# Patient Record
Sex: Female | Born: 1975 | Race: White | Hispanic: No
Health system: Southern US, Community
[De-identification: ages and names within clinical notes are randomized; demographics above are authoritative.]

---

## 2018-11-15 ENCOUNTER — Encounter: Payer: Self-pay | Admitting: Medical Oncology

## 2018-11-15 ENCOUNTER — Emergency Department: Payer: Self-pay

## 2018-11-15 ENCOUNTER — Emergency Department
Admission: EM | Admit: 2018-11-15 | Discharge: 2018-11-15 | Disposition: A | Discharge status: Other Acute Inpatient Hospital | Payer: Self-pay | Attending: Emergency Medicine | Admitting: Emergency Medicine

## 2018-11-15 DIAGNOSIS — I619 Nontraumatic intracerebral hemorrhage, unspecified: Secondary | ICD-10-CM | POA: Insufficient documentation

## 2018-11-15 DIAGNOSIS — I10 Essential (primary) hypertension: Secondary | ICD-10-CM | POA: Insufficient documentation

## 2018-11-15 DIAGNOSIS — Y908 Blood alcohol level of 240 mg/100 ml or more: Secondary | ICD-10-CM | POA: Insufficient documentation

## 2018-11-15 DIAGNOSIS — R1084 Generalized abdominal pain: Secondary | ICD-10-CM | POA: Insufficient documentation

## 2018-11-15 DIAGNOSIS — F10929 Alcohol use, unspecified with intoxication, unspecified: Secondary | ICD-10-CM

## 2018-11-15 DIAGNOSIS — R569 Unspecified convulsions: Secondary | ICD-10-CM | POA: Insufficient documentation

## 2018-11-15 LAB — ACETAMINOPHEN LEVEL: Acetaminophen (Tylenol), Serum: <10 ug/mL — ABNORMAL LOW (ref 10–30)

## 2018-11-15 LAB — CBC WITH DIFFERENTIAL/PLATELET
Abs Immature Granulocytes: 0.02 10*3/uL (ref 0.00–0.07)
Basophils Absolute: 0.1 10*3/uL (ref 0.0–0.1)
Basophils Relative: 1 %
EOS PCT: 0 %
Eosinophils Absolute: 0 10*3/uL (ref 0.0–0.5)
HCT: 35.1 % — ABNORMAL LOW (ref 36.0–46.0)
Hemoglobin: 10.9 g/dL — ABNORMAL LOW (ref 12.0–15.0)
Immature Granulocytes: 0 %
Lymphocytes Relative: 15 %
Lymphs Abs: 0.9 10*3/uL (ref 0.7–4.0)
MCH: 28.3 pg (ref 26.0–34.0)
MCHC: 31.1 g/dL (ref 30.0–36.0)
MCV: 91.2 fL (ref 80.0–100.0)
Monocytes Absolute: 0.2 10*3/uL (ref 0.1–1.0)
Monocytes Relative: 3 %
Neutro Abs: 4.8 10*3/uL (ref 1.7–7.7)
Neutrophils Relative %: 81 %
Platelets: 102 10*3/uL — ABNORMAL LOW (ref 150–400)
RBC: 3.85 MIL/uL — ABNORMAL LOW (ref 3.87–5.11)
RDW: 17.2 % — ABNORMAL HIGH (ref 11.5–15.5)
WBC: 5.9 10*3/uL (ref 4.0–10.5)
nRBC: 0 % (ref 0.0–0.2)

## 2018-11-15 LAB — URINE DRUG SCREEN, QUALITATIVE (ARMC ONLY)
Amphetamines, Ur Screen: NOT DETECTED
Barbiturates, Ur Screen: NOT DETECTED
Benzodiazepine, Ur Scrn: NOT DETECTED
Cannabinoid 50 Ng, Ur ~~LOC~~: NOT DETECTED
Cocaine Metabolite,Ur ~~LOC~~: NOT DETECTED
MDMA (ECSTASY) UR SCREEN: NOT DETECTED
Methadone Scn, Ur: NOT DETECTED
Opiate, Ur Screen: NOT DETECTED
Phencyclidine (PCP) Ur S: NOT DETECTED
Tricyclic, Ur Screen: NOT DETECTED

## 2018-11-15 LAB — COMPREHENSIVE METABOLIC PANEL
ALT: 32 U/L (ref 0–44)
AST: 40 U/L (ref 15–41)
Albumin: 4.1 g/dL (ref 3.5–5.0)
Alkaline Phosphatase: 102 U/L (ref 38–126)
Anion gap: 12 (ref 5–15)
BUN: 8 mg/dL (ref 6–20)
CO2: 24 mmol/L (ref 22–32)
Calcium: 8.5 mg/dL — ABNORMAL LOW (ref 8.9–10.3)
Chloride: 109 mmol/L (ref 98–111)
Creatinine, Ser: 0.43 mg/dL — ABNORMAL LOW (ref 0.44–1.00)
GFR calc Af Amer: >60 mL/min (ref >60)
GFR calc non Af Amer: >60 mL/min (ref >60)
Glucose, Bld: 96 mg/dL (ref 70–99)
Potassium: 3.1 mmol/L — ABNORMAL LOW (ref 3.5–5.1)
Sodium: 145 mmol/L (ref 135–145)
Total Bilirubin: 0.7 mg/dL (ref 0.3–1.2)
Total Protein: 7.3 g/dL (ref 6.5–8.1)

## 2018-11-15 LAB — LIPASE, BLOOD: Lipase: 107 U/L — ABNORMAL HIGH (ref 11–51)

## 2018-11-15 LAB — URINALYSIS, COMPLETE (UACMP) WITH MICROSCOPIC
Bilirubin Urine: NEGATIVE
Glucose, UA: NEGATIVE mg/dL
Ketones, ur: NEGATIVE mg/dL
Leukocytes,Ua: NEGATIVE
Nitrite: NEGATIVE
PH: 6 (ref 5.0–8.0)
Protein, ur: NEGATIVE mg/dL
Specific Gravity, Urine: 1.001 — ABNORMAL LOW (ref 1.005–1.030)
Squamous Epithelial / HPF: NONE SEEN (ref 0–5)

## 2018-11-15 LAB — SALICYLATE LEVEL: Salicylate Lvl: <7 mg/dL (ref 2.8–30.0)

## 2018-11-15 LAB — PREGNANCY, URINE: Preg Test, Ur: NEGATIVE

## 2018-11-15 LAB — ETHANOL: ALCOHOL ETHYL (B): 279 mg/dL — AB (ref <10)

## 2018-11-15 MED ORDER — LORAZEPAM 2 MG/ML IJ SOLN
2.0000 mg | Freq: Once | INTRAMUSCULAR | Status: AC
  Administered 2018-11-15: 2 mg via INTRAVENOUS

## 2018-11-15 MED ORDER — LORAZEPAM 2 MG/ML IJ SOLN: 1.0000 mg | Freq: Once | INTRAMUSCULAR | Status: DC

## 2018-11-15 MED ORDER — LORAZEPAM 2 MG/ML IJ SOLN
INTRAMUSCULAR | Status: AC
  Administered 2018-11-15: 2 mg via INTRAVENOUS
  Filled 2018-11-15: qty 2

## 2018-11-15 MED ORDER — THIAMINE HCL 100 MG/ML IJ SOLN
Freq: Once | INTRAVENOUS | Status: AC
  Administered 2018-11-15: 15:00:00 via INTRAVENOUS
  Filled 2018-11-15: qty 1000

## 2018-11-15 MED ORDER — ROCURONIUM BROMIDE 50 MG/5ML IV SOLN
INTRAVENOUS | Status: AC | PRN
  Administered 2018-11-15: 100 mg via INTRAVENOUS

## 2018-11-15 MED ORDER — PROPOFOL 1000 MG/100ML IV EMUL
5.0000 ug/kg/min | INTRAVENOUS | Status: DC
  Administered 2018-11-15: 10 ug/kg/min via INTRAVENOUS
  Filled 2018-11-15: qty 100

## 2018-11-15 MED ORDER — ETOMIDATE 2 MG/ML IV SOLN
INTRAVENOUS | Status: AC | PRN
  Administered 2018-11-15: 16 mg via INTRAVENOUS

## 2018-11-15 MED ORDER — LEVETIRACETAM IN NACL 1000 MG/100ML IV SOLN
1000.0000 mg | Freq: Once | INTRAVENOUS | Status: AC
  Administered 2018-11-15: 1000 mg via INTRAVENOUS
  Filled 2018-11-15: qty 100

## 2018-11-15 MED ORDER — IOHEXOL 300 MG/ML  SOLN
100.0000 mL | Freq: Once | INTRAMUSCULAR | Status: AC | PRN
  Administered 2018-11-15: 100 mL via INTRAVENOUS

## 2018-11-15 MED ORDER — ETOMIDATE 2 MG/ML IV SOLN: 16.0000 mg | Freq: Once | INTRAVENOUS | Status: DC

## 2018-11-15 MED ORDER — ACETAMINOPHEN 325 MG PO TABS
650.0000 mg | ORAL_TABLET | Freq: Once | ORAL | Status: AC
  Administered 2018-11-15: 650 mg via ORAL
  Filled 2018-11-15: qty 2

## 2018-11-15 MED ORDER — LORAZEPAM 2 MG/ML IJ SOLN
INTRAMUSCULAR | Status: AC
  Administered 2018-11-15: 2 mg via INTRAVENOUS
  Filled 2018-11-15: qty 1

## 2018-11-15 MED ORDER — LORAZEPAM 2 MG/ML IJ SOLN
INTRAMUSCULAR | Status: AC
  Filled 2018-11-15: qty 2
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
## 2018-11-15 NOTE — ED Notes (Signed)
TTS at bedside to assess patient at this time.  Will continue to monitor.

## 2018-11-15 NOTE — ED Notes (Signed)
Patient transported to CT at this time.  Will continue to monitor. 

## 2018-11-15 NOTE — BH Assessment (Signed)
Waiting for BAL to return to determine if pt will be transported to jail from the ED.

## 2018-11-15 NOTE — BH Assessment (Signed)
Pt unwilling to engage in TTS assessment. Pt easy to arouse; however, she is apparently too intoxicated to be appropriately assessed.  This Clinical research associate was informed by Consulting civil engineer that TTS consult will be removed as detox placement is not the intended disposition for this patient.  [place] police dept is here waiting for ETOH lab results to determine if it is safe to transport pt to jail for DWI.

## 2018-11-15 NOTE — ED Notes (Signed)
Patient is complaining of headache. MD notified.  

## 2018-11-15 NOTE — ED Notes (Signed)
CT called this RN and stated that after patient stood and pivoted and laid on CT table, the right side of her face began to twitch and her mouth began to foam.  This RN to CT at this time.

## 2018-11-15 NOTE — ED Notes (Signed)
Patient on CT table, began to twitch her right arm and the right side of her face and foam at the mouth.  Mouth suctioned by this RN.  MD notified and charge RN notified.

## 2018-11-15 NOTE — ED Triage Notes (Signed)
Pt was pulled over by [facility] PD for DWI, per officer pt is too intoxicated to go to jail. PD is coming with search warrant for forensic blood draw also. Pt denies any substance ingestion today , states that she drank last night. Pt reports she was going to the store to buy some tampons.

## 2020-10-02 IMAGING — DX DG CERVICAL SPINE 2-3V CLEARING
3 series · 3 of 3 positions shown · non-contrast
Comparison: 11/15/2018 CT

CLINICAL DATA: Intoxicated

EXAM:
LIMITED CERVICAL SPINE FOR TRAUMA CLEARING - 2-3 VIEW

[c-spine lat]
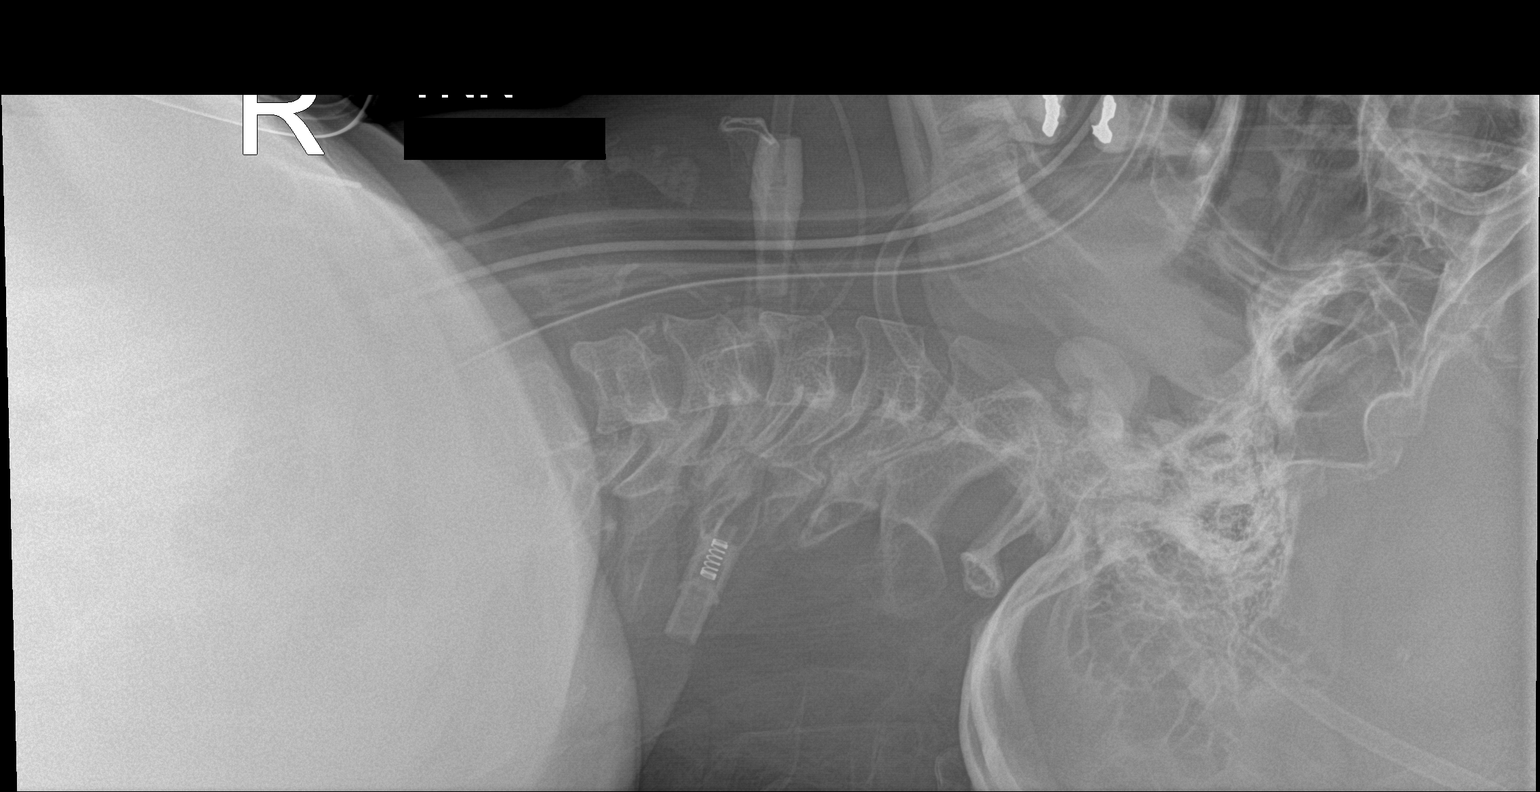

[c-spine ap]
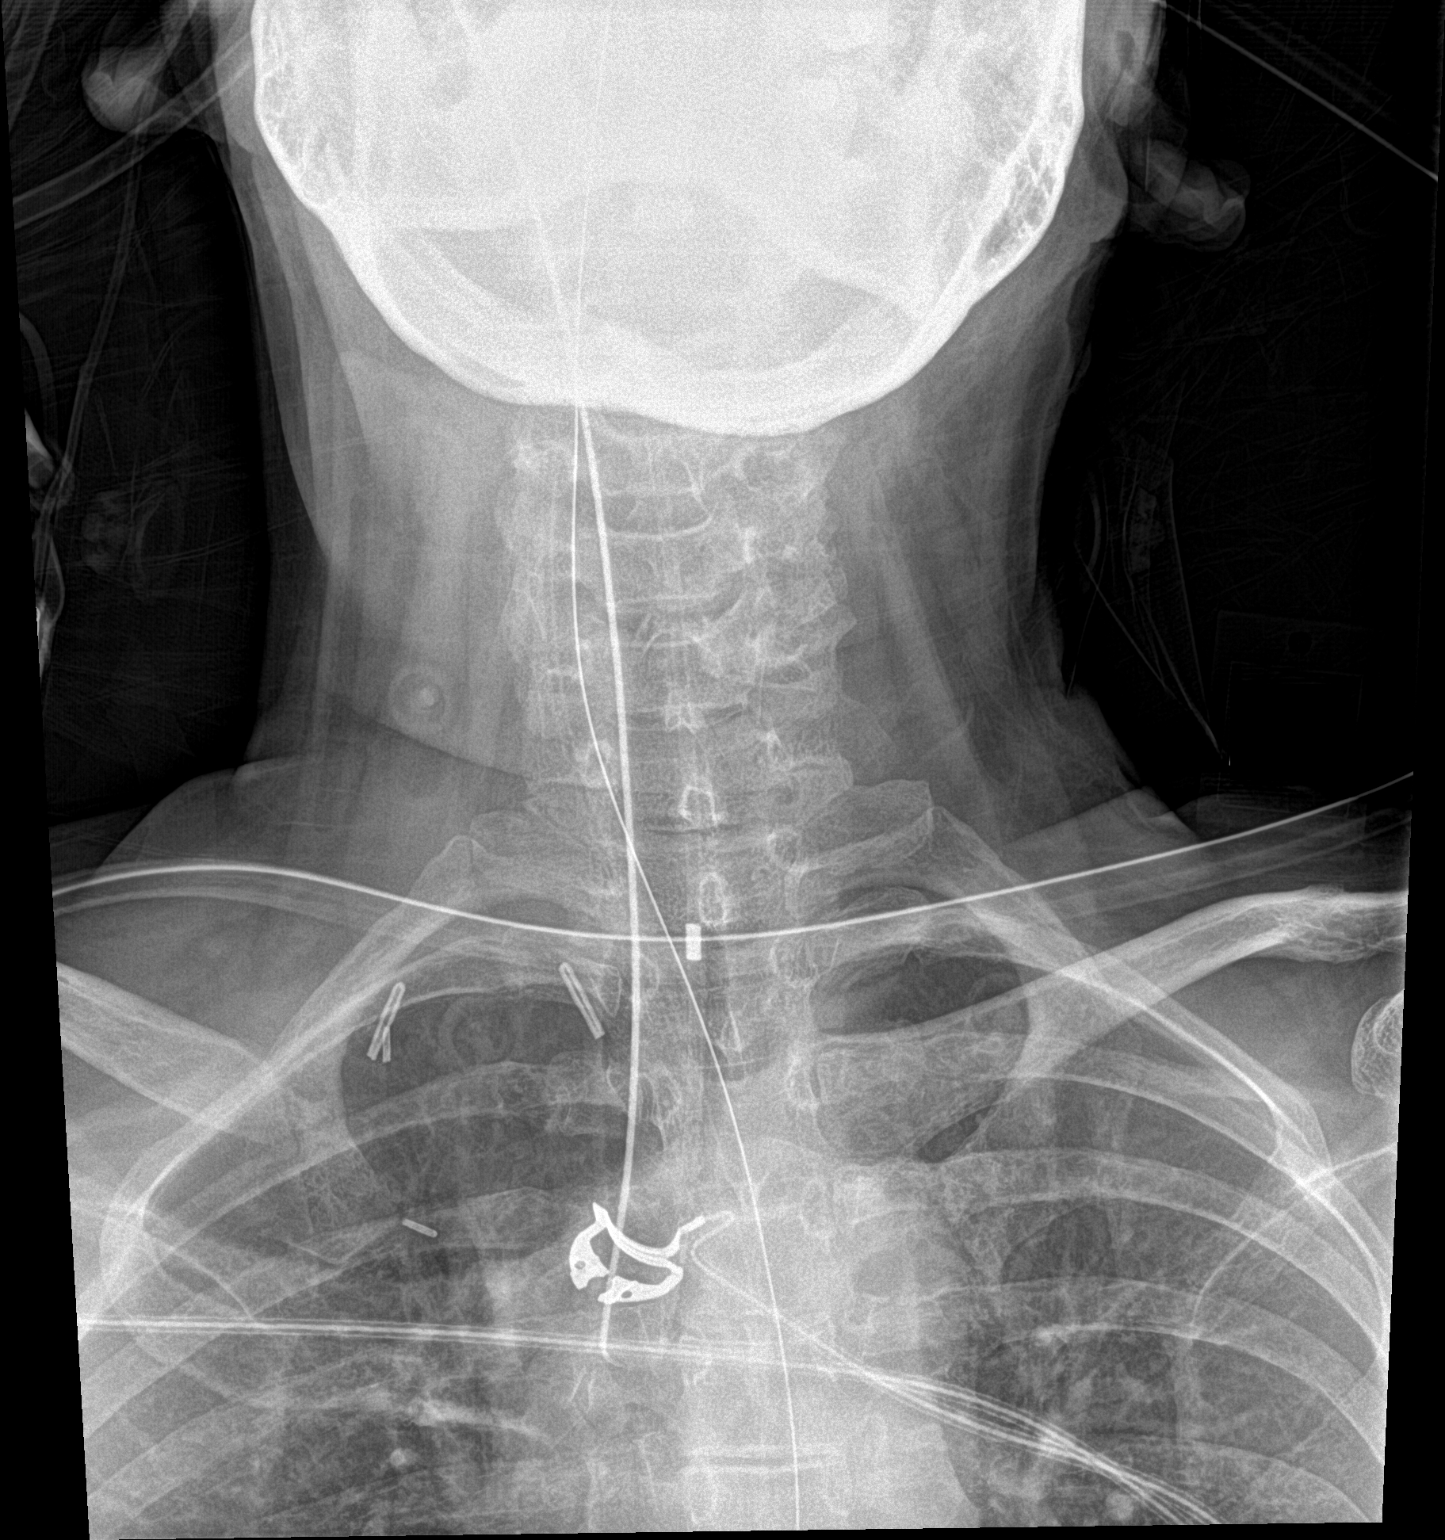

[c-spine open mouth]
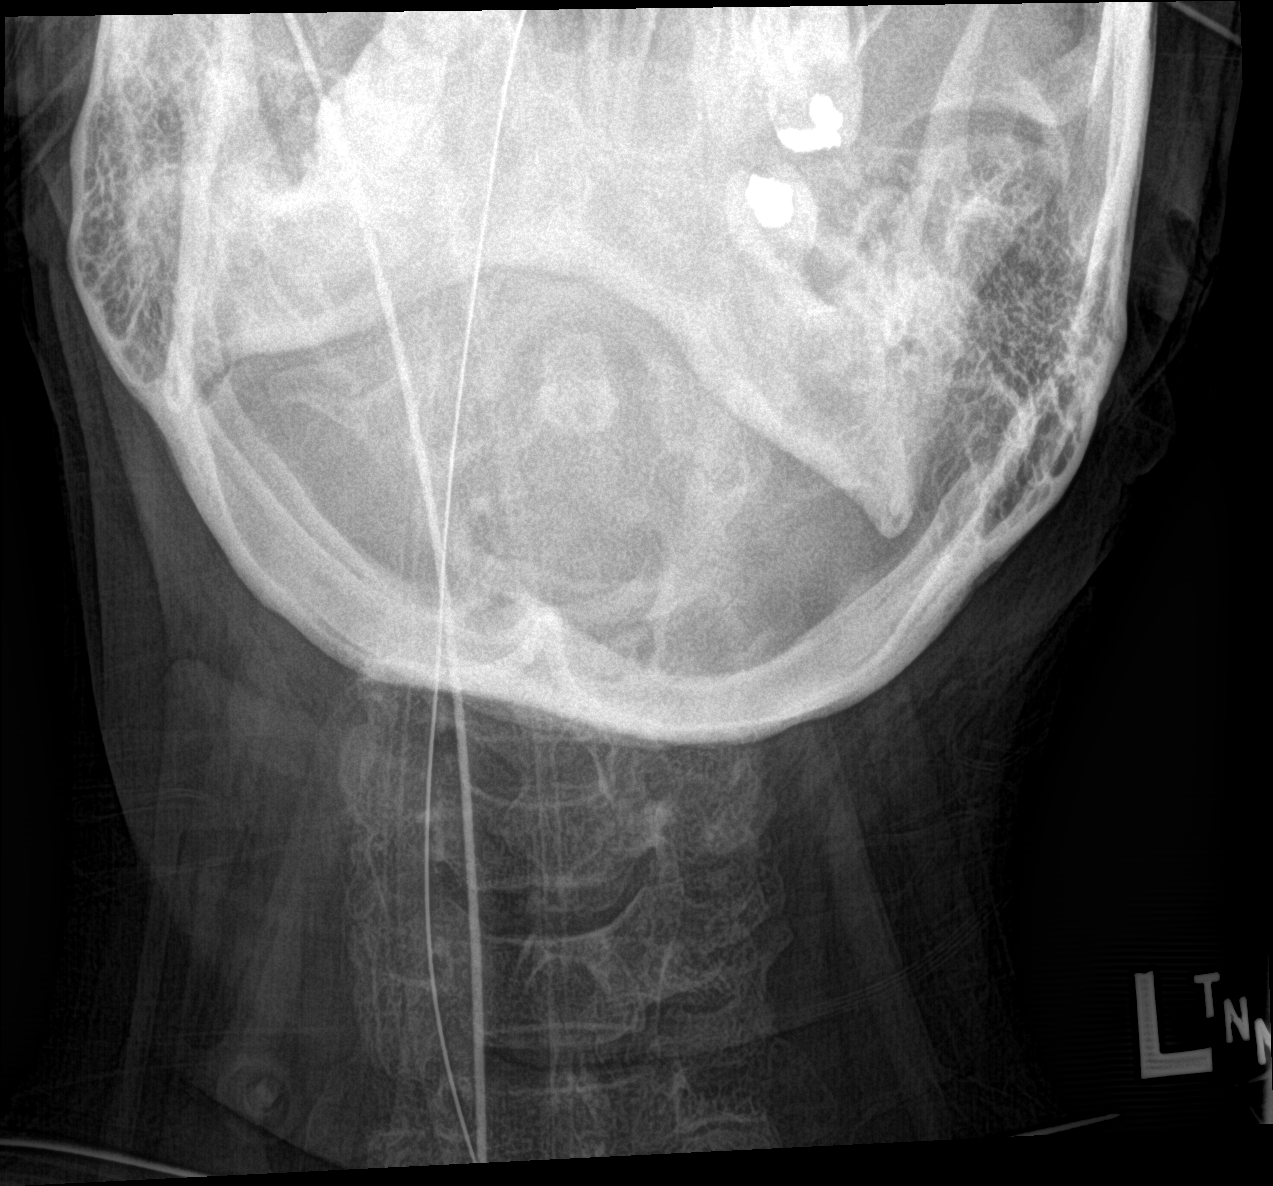

[3 of 3 positions shown; findings below may reference images not displayed]

FINDINGS: Limited cross-table lateral and AP views of the cervical spine.
Suboptimal visualization of cervicothoracic junction. Lateral
alignment within normal limits. Inadequately visualized dens and
lateral masses.
IMPRESSION: Inadequately evaluated cervicothoracic junction, dens and lateral
masses. Limited lateral view demonstrates grossly normal alignment
to C7.

## 2020-10-02 IMAGING — CT CT ABD-PELV W/ CM
2 of 4 series · 16 of 46 positions shown, 18 images · IV contrast (APPLIED)
Comparison: None.

CLINICAL DATA: Acute generalized abdominal pain.

EXAM:
CT ABDOMEN AND PELVIS WITH CONTRAST
TECHNIQUE: Multidetector CT imaging of the abdomen and pelvis was performed
using the standard protocol following bolus administration of
intravenous contrast.
CONTRAST:  100mL OMNIPAQUE IOHEXOL 300 MG/ML  SOLN

[Series 2: routine abd/pel with · axial · 0.70mm/px · z∈[-439,-29]mm · 13 of 90 slices shown, 15 images]
[im 4/90  soft-tissue]
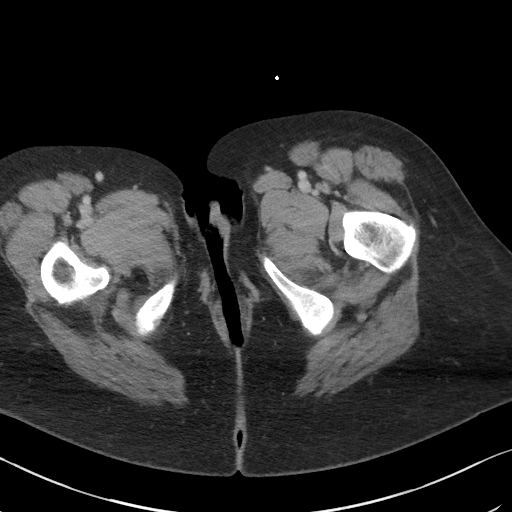
[im 4/90  bone]
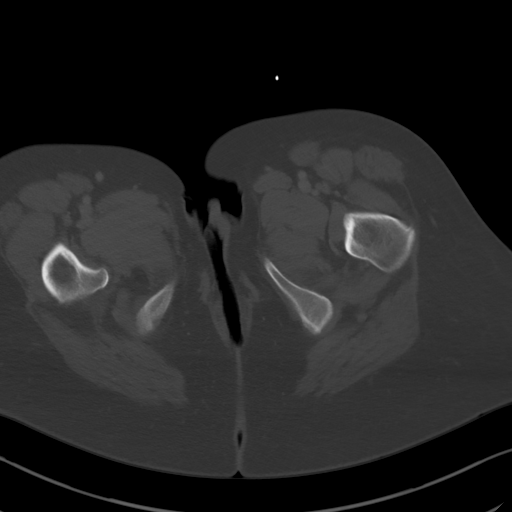
[im 11/90  soft-tissue]
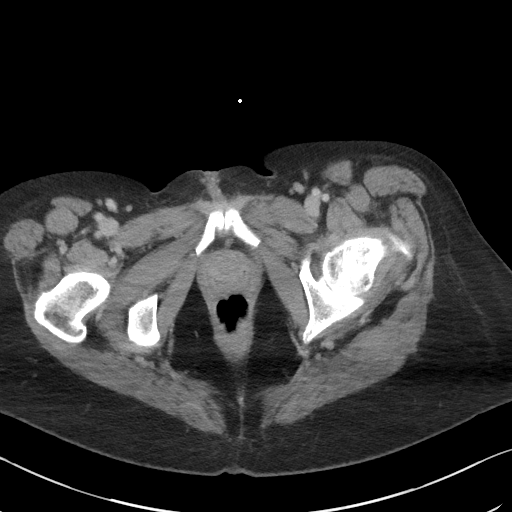
[im 18/90  soft-tissue]
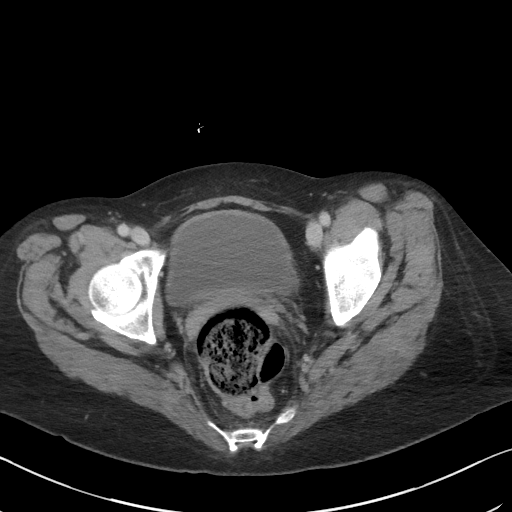
[im 24/90  soft-tissue]
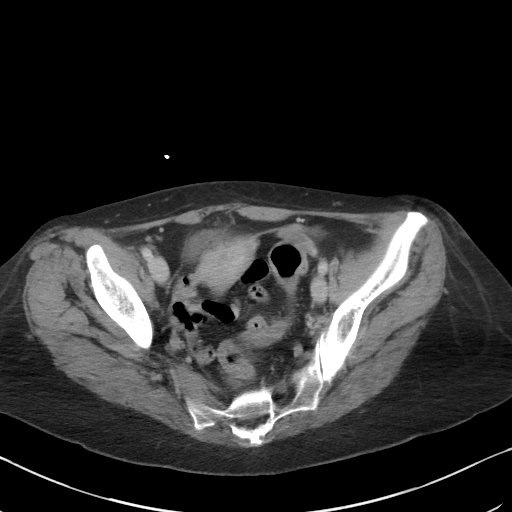
[im 31/90  soft-tissue]
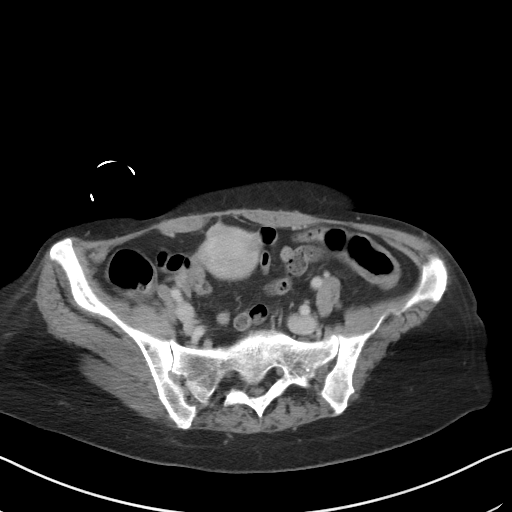
[im 38/90  soft-tissue]
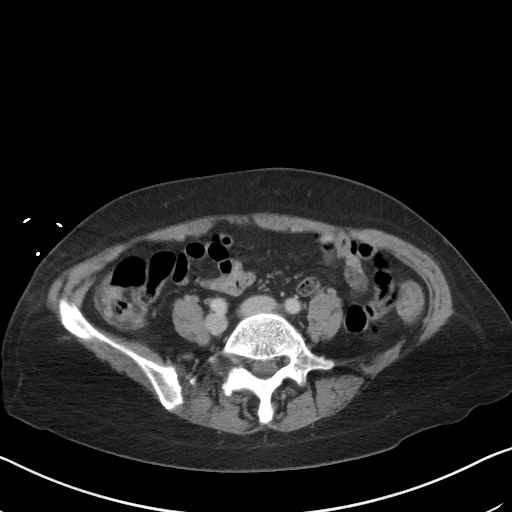
[im 45/90  soft-tissue]
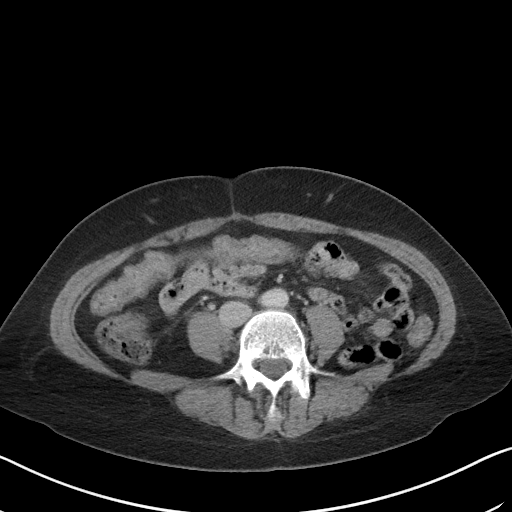
[im 52/90  soft-tissue]
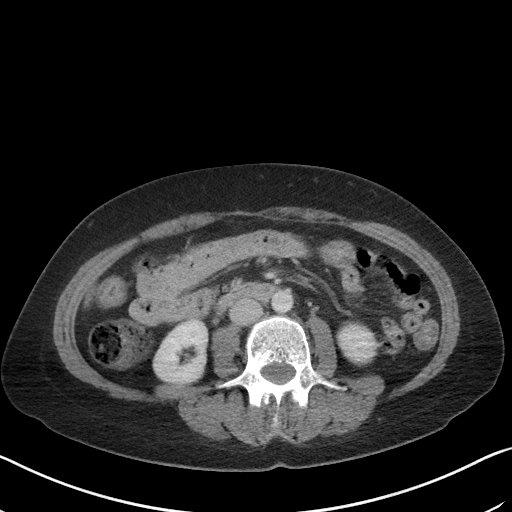
[im 59/90  soft-tissue]
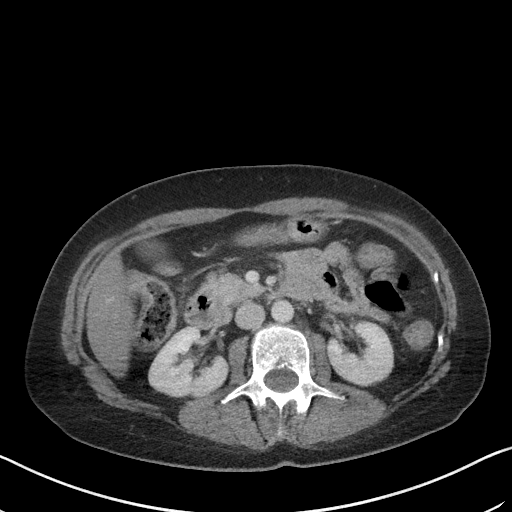
[im 59/90  bone]
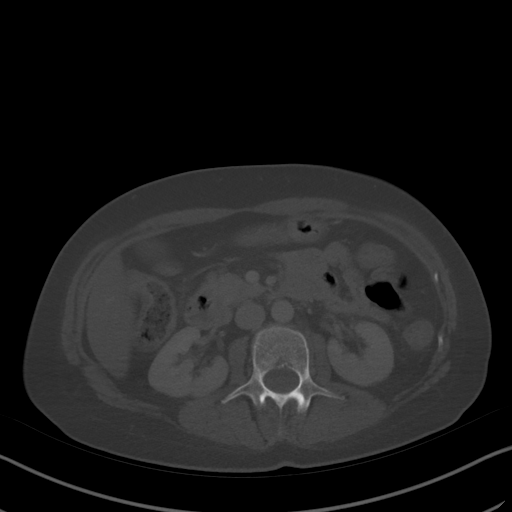
[im 66/90  soft-tissue]
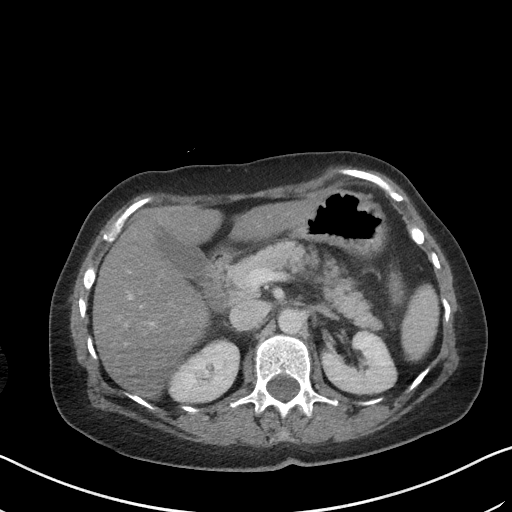
[im 72/90  soft-tissue]
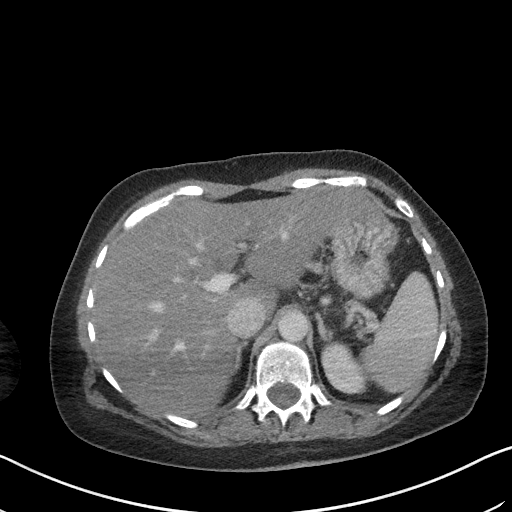
[im 79/90  soft-tissue]
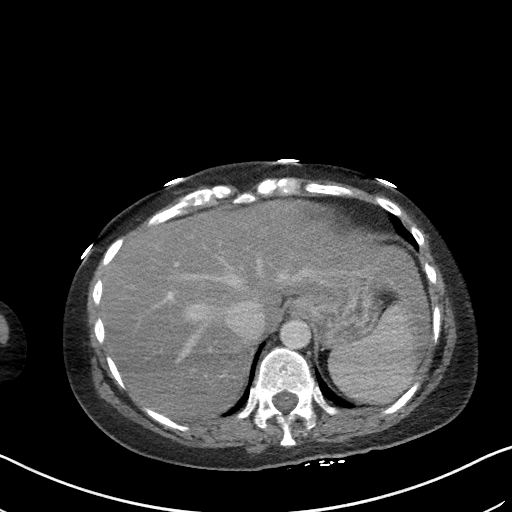
[im 86/90  soft-tissue]
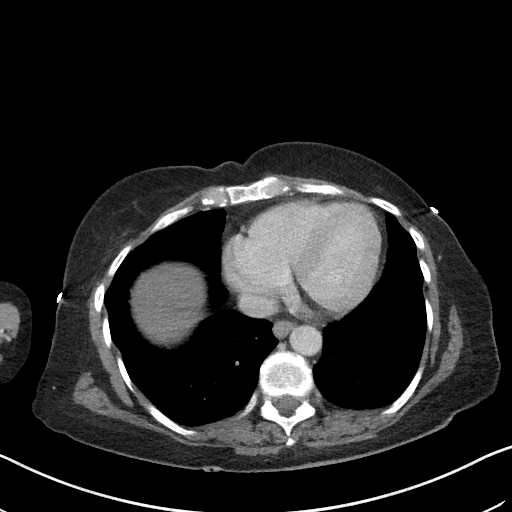

[Series 5: coronal st · coronal · 0.63mm/px · 3 of 71 slices shown]
[im 24/71  soft-tissue]
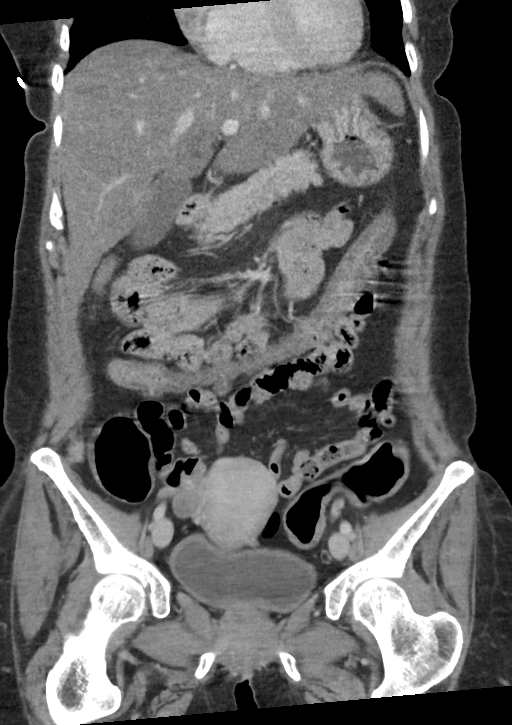
[im 32/71  soft-tissue]
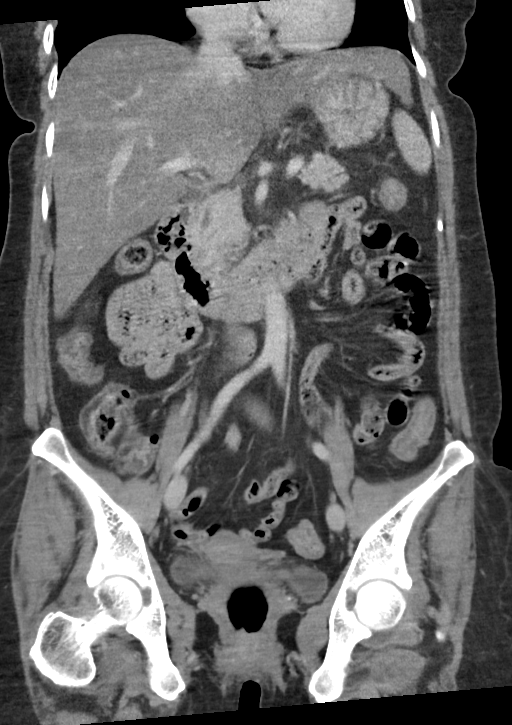
[im 39/71  soft-tissue]
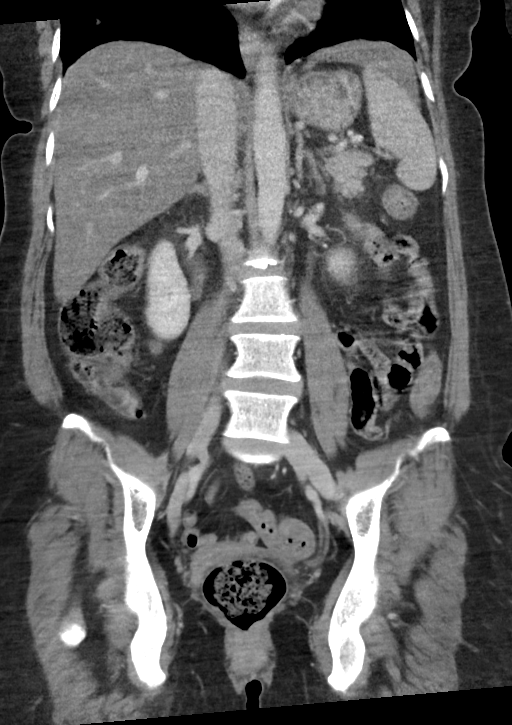

[16 of 46 positions shown; findings below may reference images not displayed]

FINDINGS: Lower chest: No acute abnormality.

Hepatobiliary: Hepatic steatosis. No gallstones or biliary
dilatation is noted.

Pancreas: Unremarkable. No pancreatic ductal dilatation or
surrounding inflammatory changes.

Spleen: Normal in size without focal abnormality.

Adrenals/Urinary Tract: Adrenal glands are unremarkable. Kidneys are
normal, without renal calculi, focal lesion, or hydronephrosis.
Bladder is unremarkable.

Stomach/Bowel: The stomach appears normal. The appendix is not
clearly visualized. There does appear to be wall thickening
involving portions of the transverse and descending colon suggesting
possible inflammatory or infectious colitis. There is no evidence of
bowel obstruction.

Vascular/Lymphatic: No significant vascular findings are present. No
enlarged abdominal or pelvic lymph nodes.

Reproductive: Uterus and bilateral adnexa are unremarkable.

Other: No abdominal wall hernia or abnormality. No abdominopelvic
ascites.

Musculoskeletal: No acute or significant osseous findings.
IMPRESSION: Mild wall thickening of portions of the transverse and descending
colon is noted concerning for infectious or inflammatory colitis.

Hepatic steatosis.
